# Patient Record
Sex: Female | Born: 1940 | Race: White | Hispanic: No | Marital: Married | Smoking: Never smoker
Health system: Southern US, Community
[De-identification: ages and names within clinical notes are randomized; demographics above are authoritative.]

## PROBLEM LIST (undated history)

## (undated) DIAGNOSIS — M1990 Unspecified osteoarthritis, unspecified site: Secondary | ICD-10-CM

## (undated) DIAGNOSIS — I10 Essential (primary) hypertension: Secondary | ICD-10-CM

## (undated) HISTORY — PX: ULNAR TUNNEL RELEASE: SHX820

## (undated) HISTORY — DX: Unspecified osteoarthritis, unspecified site: M19.90

## (undated) HISTORY — PX: HAND SURGERY: SHX662

## (undated) HISTORY — DX: Essential (primary) hypertension: I10

---

## 2003-09-01 ENCOUNTER — Other Ambulatory Visit: Admission: RE | Admit: 2003-09-01 | Discharge: 2003-09-01 | Payer: Self-pay | Admitting: Family Medicine

## 2003-09-09 ENCOUNTER — Ambulatory Visit (HOSPITAL_COMMUNITY): Admission: RE | Admit: 2003-09-09 | Discharge: 2003-09-09 | Payer: Self-pay | Admitting: Family Medicine

## 2003-10-20 ENCOUNTER — Encounter (INDEPENDENT_AMBULATORY_CARE_PROVIDER_SITE_OTHER): Payer: Self-pay | Admitting: *Deleted

## 2003-10-20 ENCOUNTER — Ambulatory Visit (HOSPITAL_COMMUNITY): Admission: RE | Admit: 2003-10-20 | Discharge: 2003-10-20 | Payer: Self-pay | Admitting: Gastroenterology

## 2005-04-23 ENCOUNTER — Ambulatory Visit: Payer: Self-pay | Admitting: Gastroenterology

## 2005-04-25 ENCOUNTER — Encounter (INDEPENDENT_AMBULATORY_CARE_PROVIDER_SITE_OTHER): Payer: Self-pay | Admitting: Specialist

## 2005-04-25 ENCOUNTER — Ambulatory Visit: Payer: Self-pay | Admitting: Gastroenterology

## 2007-08-04 ENCOUNTER — Ambulatory Visit: Payer: Self-pay | Admitting: Gastroenterology

## 2007-09-02 ENCOUNTER — Ambulatory Visit: Payer: Self-pay | Admitting: Gastroenterology

## 2008-04-02 ENCOUNTER — Ambulatory Visit: Payer: Self-pay | Admitting: Family Medicine

## 2008-04-02 ENCOUNTER — Observation Stay (HOSPITAL_COMMUNITY): Admission: EM | Admit: 2008-04-02 | Discharge: 2008-04-05 | Payer: Self-pay | Admitting: Family Medicine

## 2008-04-04 ENCOUNTER — Encounter: Payer: Self-pay | Admitting: Gastroenterology

## 2008-04-06 ENCOUNTER — Ambulatory Visit: Payer: Self-pay | Admitting: Gastroenterology

## 2008-04-12 ENCOUNTER — Ambulatory Visit: Payer: Self-pay | Admitting: Gastroenterology

## 2008-04-19 ENCOUNTER — Ambulatory Visit: Payer: Self-pay | Admitting: Gastroenterology

## 2008-07-15 ENCOUNTER — Encounter: Admission: RE | Admit: 2008-07-15 | Discharge: 2008-07-15 | Payer: Self-pay | Admitting: Family Medicine

## 2008-08-03 ENCOUNTER — Encounter
Admission: RE | Admit: 2008-08-03 | Discharge: 2008-08-03 | Payer: Self-pay | Admitting: Physical Medicine and Rehabilitation

## 2008-09-29 ENCOUNTER — Ambulatory Visit (HOSPITAL_COMMUNITY): Admission: RE | Admit: 2008-09-29 | Discharge: 2008-09-29 | Payer: Self-pay | Admitting: Gastroenterology

## 2008-10-04 ENCOUNTER — Ambulatory Visit (HOSPITAL_COMMUNITY): Admission: RE | Admit: 2008-10-04 | Discharge: 2008-10-04 | Payer: Self-pay | Admitting: Gastroenterology

## 2009-08-31 ENCOUNTER — Encounter: Admission: RE | Admit: 2009-08-31 | Discharge: 2009-08-31 | Payer: Self-pay | Admitting: Family Medicine

## 2010-04-05 ENCOUNTER — Inpatient Hospital Stay (HOSPITAL_COMMUNITY): Admission: EM | Admit: 2010-04-05 | Discharge: 2010-04-06 | Payer: Self-pay | Admitting: Emergency Medicine

## 2010-05-30 ENCOUNTER — Encounter: Admission: RE | Admit: 2010-05-30 | Discharge: 2010-05-30 | Payer: Self-pay | Admitting: Gastroenterology

## 2010-11-17 ENCOUNTER — Encounter: Payer: Self-pay | Admitting: Gastroenterology

## 2011-01-14 LAB — CBC
HCT: 36.6 % (ref 36.0–46.0)
HCT: 44.9 % (ref 36.0–46.0)
Hemoglobin: 12.1 g/dL (ref 12.0–15.0)
Hemoglobin: 15.2 g/dL — ABNORMAL HIGH (ref 12.0–15.0)
MCHC: 33.8 g/dL (ref 30.0–36.0)
MCV: 91.4 fL (ref 78.0–100.0)
MCV: 92.2 fL (ref 78.0–100.0)
Platelets: 241 10*3/uL (ref 150–400)
RBC: 3.97 MIL/uL (ref 3.87–5.11)
RBC: 4.91 MIL/uL (ref 3.87–5.11)
RDW: 13.1 % (ref 11.5–15.5)
WBC: 12.6 10*3/uL — ABNORMAL HIGH (ref 4.0–10.5)
WBC: 4.6 10*3/uL (ref 4.0–10.5)

## 2011-01-14 LAB — URINE MICROSCOPIC-ADD ON

## 2011-01-14 LAB — DIFFERENTIAL
Basophils Absolute: 0 10*3/uL (ref 0.0–0.1)
Basophils Relative: 0 % (ref 0–1)
Eosinophils Absolute: 0.1 10*3/uL (ref 0.0–0.7)
Eosinophils Relative: 1 % (ref 0–5)
Lymphocytes Relative: 17 % (ref 12–46)
Lymphs Abs: 2.2 10*3/uL (ref 0.7–4.0)
Monocytes Absolute: 0.7 10*3/uL (ref 0.1–1.0)
Monocytes Relative: 6 % (ref 3–12)
Neutro Abs: 9.6 10*3/uL — ABNORMAL HIGH (ref 1.7–7.7)
Neutrophils Relative %: 76 % (ref 43–77)

## 2011-01-14 LAB — HEPATIC FUNCTION PANEL
ALT: 22 U/L (ref 0–35)
AST: 28 U/L (ref 0–37)
Albumin: 4.4 g/dL (ref 3.5–5.2)
Alkaline Phosphatase: 47 U/L (ref 39–117)
Bilirubin, Direct: <0.1 mg/dL (ref 0.0–0.3)
Total Bilirubin: 0.9 mg/dL (ref 0.3–1.2)
Total Protein: 7.7 g/dL (ref 6.0–8.3)

## 2011-01-14 LAB — POCT CARDIAC MARKERS
CKMB, poc: 2.6 ng/mL (ref 1.0–8.0)
Myoglobin, poc: 71.1 ng/mL (ref 12–200)
Myoglobin, poc: 80.3 ng/mL (ref 12–200)

## 2011-01-14 LAB — URINALYSIS, ROUTINE W REFLEX MICROSCOPIC
Bilirubin Urine: NEGATIVE
Glucose, UA: NEGATIVE mg/dL
Hgb urine dipstick: NEGATIVE
Ketones, ur: >80 mg/dL — AB
Nitrite: NEGATIVE
Protein, ur: NEGATIVE mg/dL
Specific Gravity, Urine: 1.016 (ref 1.005–1.030)
Urobilinogen, UA: 0.2 mg/dL (ref 0.0–1.0)
pH: 8 (ref 5.0–8.0)

## 2011-01-14 LAB — POCT I-STAT, CHEM 8
BUN: 13 mg/dL (ref 6–23)
Calcium, Ion: 1.11 mmol/L — ABNORMAL LOW (ref 1.12–1.32)
Chloride: 102 mEq/L (ref 96–112)
Creatinine, Ser: 0.8 mg/dL (ref 0.4–1.2)
Glucose, Bld: 129 mg/dL — ABNORMAL HIGH (ref 70–99)
HCT: 48 % — ABNORMAL HIGH (ref 36.0–46.0)
Hemoglobin: 16.3 g/dL — ABNORMAL HIGH (ref 12.0–15.0)
Potassium: 3.6 mEq/L (ref 3.5–5.1)
Sodium: 139 mEq/L (ref 135–145)
TCO2: 31 mmol/L (ref 0–100)

## 2011-01-14 LAB — COMPREHENSIVE METABOLIC PANEL
AST: 20 U/L (ref 0–37)
CO2: 27 mEq/L (ref 19–32)
Chloride: 111 mEq/L (ref 96–112)
Creatinine, Ser: 0.63 mg/dL (ref 0.4–1.2)
GFR calc Af Amer: >60 mL/min (ref >60)
GFR calc non Af Amer: >60 mL/min (ref >60)
Glucose, Bld: 94 mg/dL (ref 70–99)
Total Bilirubin: 0.7 mg/dL (ref 0.3–1.2)

## 2011-01-14 LAB — URINE CULTURE: Colony Count: 5000

## 2011-01-14 LAB — PROTIME-INR
INR: 0.84 (ref 0.00–1.49)
Prothrombin Time: 11.4 seconds — ABNORMAL LOW (ref 11.6–15.2)

## 2011-01-14 LAB — LIPASE, BLOOD: Lipase: 25 U/L (ref 11–59)

## 2011-03-12 NOTE — H&P (Signed)
Briana Gentry, Briana Gentry               ACCOUNT NO.:  [id number]   MEDICAL RECORD NO.:  [id number]          PATIENT TYPE:  OBV   LOCATION:  5154                         FACILITY:  [facility]   PHYSICIAN:  Santiago Bumpers. Hensel, M.D.DATE OF BIRTH:  [birth year]   DATE OF ADMISSION:  04/02/2008  DATE OF DISCHARGE:                              HISTORY & PHYSICAL   CHIEF COMPLAINT:  Abdominal pain and vomiting.   PRIMARY CARE PHYSICIAN:  Unassigned.   HISTORY OF PRESENT ILLNESS:  This is a 70 year old female with history  of hypertension and questionable duodenal ulcer who presents with  abdominal pain, nausea, and vomiting x2 days.  Pain started as  periumbilical and epigastric cramping characterized as rolling pain that  radiated to the right lower quadrant.  No radiation to back, not tearing  in nature.  Also, the patient with nausea and vomiting.  Nonbilious,  nonbloody.  The patient seen in [facility], given Phenergan x2,  which resolved nausea, but 1 day prior to admission, pain returned so  the patient returned to [place]. White blood cells 14.  The patient  admitted for rehydration and observation.  Of note, the patient recently  had decreased p.o. intake and had diarrhea x1.  No blood in stool.  The  patient has history previously of 1 similar episode 2 years ago, not as  severe though.  Of note, the patient also has history of ulcer.   ALLERGIES:  None.   PAST MEDICAL HISTORY:  1. Hypertension.  2. Osteoarthritis.  3. Hyperlipidemia.  4. Ulcer at the top of intestine per patient, noted in 2004.  5. History of Schatzki ring of the esophagus in 2004, status post      esophageal dilation.   MEDICATIONS:  1. Naprosyn 500 mg 1 p.o. b.i.d. for very long time.  2. Felodipine, unknown dose.  3. Advicor, unknown dose.   FAMILY HISTORY:  History of diabetes, cancer, emphysema, and  hypertension.   SOCIAL HISTORY:  The patient quit smoking in 1992.  The patient drinks  alcohol  socially.  She lives with her husband and they travel throughout  the country with RV.  The patient is homebound, is living in [place].   PHYSICAL EXAM:  VITAL SIGNS:  Temperature 98.5, heart rate 104,  respiratory 20, blood pressure 149/88, and O2 sat 97% on room air.  GENERAL:  Obviously uncomfortable, unable to find comfortable position  on bed.  HEENT:  Normocephalic, atraumatic.  Pupils are equally round and  reactive to light.  Extraocular movements intact.  Dry mucous membranes.  No pharyngeal erythema or edema.  No lymphadenopathy.  CVS:  Normal S1 and S2.  No murmurs, rubs, or gallops, tachycardic.  PULMONARY:  Decreased breath sounds on the left side, otherwise no  crackles or wheezing.  ABDOMEN:  Soft, but involuntary guarding upon palpation.  Positive  rebound epigastrically.  Tender to light palpation in the epigastric  region.  Tender to deep palpation diffusely throughout, nondistended.  No hepatosplenomegaly.  EXTREMITIES:  No clubbing, cyanosis, or edema.  SKIN:  No rash or jaundice.   White blood  cell at Urgent Care 14.8 along with hemoglobin 30.0,  hematocrit 41, and platelets 370.  Labs here are pending.   ASSESSMENT AND PLAN:  This is a 70 year old female with history of  hypertension and questionable duodenal ulcer, presented with 2-day  history of nausea, vomiting, and abdominal pain who has been afebrile  throughout.  1. Abdominal pain with vomiting, broad differential most concerning      with the acute abdomen with appendicitis or perforated ulcer versus      cholecystitis versus ruptured abdominal aortic aneurysm.  Recheck      hemoglobin.  CT with contrast to evaluate abdomen for peritonitis      etiology. Serial abdominal exams.  Consult surgery depending on      results of CT scan.  Check CMP, CBC, PT/PTT, INR and consider in      differential acute gastroenteritis.  The patient remains afebrile.      Given concern for acute abdomen, prep the patient for  surgery with      chest x-ray EKG, PT/INR.  If impressed with CTs, consider starting      antibiotics for coverage of gastrointestinal bugs.  2. Nausea.  Zofran and Phenergan IV.  3. Hypertension, continue felodipine.  4. Hyperlipidemia, continue Advicor  5. Osteoarthritis, hold Naprosyn for now.  6. Fluids, electrolytes, nutrition, n.p.o. for now, maintenance IV      fluids after a bolus.  7. Disposition pending CT results and add pain control with morphine      IV.      Eustaquio Boyden, MD  Electronically Signed      Santiago Bumpers. Leveda Anna, M.D.  Electronically Signed    JG/MEDQ  D:  04/02/2008  T:  04/03/2008  Job:  [id number]

## 2011-03-12 NOTE — Discharge Summary (Signed)
NAME:  Briana Gentry, Briana Gentry               ACCOUNT NO.:  [id number]   MEDICAL RECORD NO.:  [id number]          PATIENT TYPE:  OBV   LOCATION:  5154                         FACILITY:  [facility]   PHYSICIAN:  Wayne A. Sheffield Slider, M.D.    DATE OF BIRTH:  Jul 06, 1941   DATE OF ADMISSION:  04/02/2008  DATE OF DISCHARGE:  04/05/2008                               DISCHARGE SUMMARY   PRIMARY CARE PHYSICIAN:  Charlesetta Shanks.   CONSULT:  Gastroenterology, Barbette Hair. Arlyce Dice, MD, [facility]   DISCHARGE DIAGNOSES:  1. 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  2. Hypertension.  3. Osteoarthritis.  4. Hyperlipidemia.   DISCHARGE MEDICATIONS:  1. Tylenol 650 mg q.6 h. for arthritic pain.  2. Felodipine as previous regimen.  3. Advicor one daily.  4. Protonix 20 mg daily or equivalent.  5. Keflex 500 mg q.i.d. for 7 days.   PROCEDURES:  The patient underwent esophageal dilation and upper  endoscopy per GI on April 04, 2008.  1. Chest x-ray showing no active disease.  2. Pelvic and abdominal CT showing marked distal esophagitis extending      into the gastroesophageal junction and probable chronic wall      thickening involving distal gastric antrum and pylorus due to the      patient's previous ulcer disease.  Active inflammation also      possibility.  No acute pelvic abnormality.   ADMISSION LABS:  White blood cell 10.6, hemoglobin 14.2, platelets 275,  PT 12.1, and INR 0.9.  Sodium 135, potassium 2.8, BUN 12, and creatinine  0.62.  LFTs within normal limits, lipase 14.  Urinalysis, nitrite  negative.  Leukocyte esterase trace, microscopic with rare bacteria.   DISCHARGE LABS:  Blood culture x2, no growth.  Urine culture initial  with insignificant gross growth.  Repeat urinalysis showing positive  nitrites, large leukocyte esterases along with 40 ketones and many  bacteria.  Urine culture repeat pending.  White blood cell 10.2,  platelets 292, hemoglobin 11.3, sodium 142, glucose 89, bicarb 29, BUN  30, creatinine 0.72, and calcium 8.5.   HOSPITAL COURSE:  For full summary please see dictated H&P.  In short,  this is a 70 year old pleasant female with history of Schatzki ring  status post esophageal dilation in 2004 who presented with nausea,  vomiting, and abdominal pain and found to have distal esophagitis.  1. GI.  The patient presented with evidence of repeat esophageal      stricture.  Gastroenterology was consulted and they performed a      upper GI with esophageal dilation.  No evidence of ulcers were      found here.  The patient will return in 2 weeks for followup with      GI for repeat dilation given the size of the stricture.  The      patient's nausea and abdominal pain resolved on hospitalization.      The patient is not to take any more NSAIDs.  Abdominal pain was      thought to be contributed by chronic Naprosyn use for      osteoarthritis.  The patient sent home with prescription for      Protonix or Prilosec, whichever is cheaper for her.  2. Hypertension.  Initially, the patient presented hypotensive with      systolics in 90s-10s.  Upon discharge the patient's felodipine was      restarted with good tolerance.  3. Hyperlipidemia.  Advicor was continued throughout hospitalization.  4. Osteoarthritis.  Naprosyn was discontinued and the patient was      instructed to use high-dose Tylenol to control pain.  This is to be      followed up by PCP.  5. Dysuria.  Repeat ultrasound showing urinary tract infection.  Urine      culture pending.  The patient sent home with Keflex 500 mg q.i.d.      to complete a 7-day course.  The patient expressed understanding.   FOLLOWUP:  The patient to follow up with Dr. Celene Skeen, PCP, on April 12, 2008 at 4:00 p.m. and the patient to follow up with Dr. Arlyce Dice as well  on April 12, 2008 at 8:00 a.m. in the morning.      Eustaquio Boyden, MD  Electronically Signed      Arnette Norris. Sheffield Slider, M.D.  Electronically Signed    JG/MEDQ   D:  04/05/2008  T:  04/06/2008  Job:  [id number]   cc:   Haydee Salter. Arlyce Dice, MD,FACG

## 2011-07-25 LAB — COMPREHENSIVE METABOLIC PANEL
ALT: 15
BUN: 12
BUN: 8
Calcium: 7.4 — ABNORMAL LOW
Calcium: 8.4
Creatinine, Ser: 0.68
Glucose, Bld: 102 — ABNORMAL HIGH
Glucose, Bld: 121 — ABNORMAL HIGH
Sodium: 132 — ABNORMAL LOW
Total Protein: 5.3 — ABNORMAL LOW
Total Protein: 6.2

## 2011-07-25 LAB — CBC
HCT: 29.5 — ABNORMAL LOW
HCT: 31.2 — ABNORMAL LOW
HCT: 42.8
Hemoglobin: 10.6 — ABNORMAL LOW
Hemoglobin: 10.8 — ABNORMAL LOW
Hemoglobin: 14.2
MCHC: 33.3
MCHC: 33.8
MCV: 87.6
MCV: 87.7
MCV: 88
Platelets: 221
Platelets: 292
RBC: 3.56 — ABNORMAL LOW
RDW: 15.7 — ABNORMAL HIGH
RDW: 16.2 — ABNORMAL HIGH
RDW: 16.4 — ABNORMAL HIGH
RDW: 16.7 — ABNORMAL HIGH
WBC: 10.2
WBC: 12.2 — ABNORMAL HIGH

## 2011-07-25 LAB — CULTURE, BLOOD (ROUTINE X 2): Culture: NO GROWTH

## 2011-07-25 LAB — BASIC METABOLIC PANEL
BUN: 3 — ABNORMAL LOW
Creatinine, Ser: 0.72
GFR calc non Af Amer: >60
Glucose, Bld: 89

## 2011-07-25 LAB — URINALYSIS, ROUTINE W REFLEX MICROSCOPIC
Glucose, UA: 100 — AB
Hgb urine dipstick: NEGATIVE
Ketones, ur: 40 — AB
Ketones, ur: 40 — AB
Nitrite: POSITIVE — AB
Protein, ur: NEGATIVE
Specific Gravity, Urine: 1.008
Urobilinogen, UA: 1
pH: 6.5

## 2011-07-25 LAB — PROTIME-INR
INR: 0.9
Prothrombin Time: 12.1

## 2011-07-25 LAB — DIFFERENTIAL
Lymphocytes Relative: 10 — ABNORMAL LOW
Lymphs Abs: 1
Monocytes Relative: 3
Neutro Abs: 9.2 — ABNORMAL HIGH
Neutrophils Relative %: 87 — ABNORMAL HIGH

## 2011-07-25 LAB — URINE MICROSCOPIC-ADD ON

## 2011-07-25 LAB — URINE CULTURE

## 2011-07-25 LAB — APTT: aPTT: 25

## 2011-09-24 ENCOUNTER — Other Ambulatory Visit: Payer: Self-pay | Admitting: Family Medicine

## 2011-09-24 DIAGNOSIS — Z1231 Encounter for screening mammogram for malignant neoplasm of breast: Secondary | ICD-10-CM

## 2011-10-03 ENCOUNTER — Ambulatory Visit
Admission: RE | Admit: 2011-10-03 | Discharge: 2011-10-03 | Disposition: A | Discharge status: Home / Self Care | Payer: Medicare Other | Source: Ambulatory Visit | Attending: Family Medicine | Admitting: Family Medicine

## 2011-10-03 DIAGNOSIS — Z1231 Encounter for screening mammogram for malignant neoplasm of breast: Secondary | ICD-10-CM

## 2016-02-16 ENCOUNTER — Encounter: Payer: Self-pay | Admitting: Gastroenterology

## 2020-07-20 ENCOUNTER — Other Ambulatory Visit: Payer: Self-pay

## 2020-07-20 ENCOUNTER — Ambulatory Visit (HOSPITAL_COMMUNITY)
Admission: EM | Admit: 2020-07-20 | Discharge: 2020-07-20 | Disposition: A | Discharge status: Home / Self Care | Payer: Medicare Other

## 2020-07-20 ENCOUNTER — Emergency Department (HOSPITAL_COMMUNITY)
Admission: EM | Admit: 2020-07-20 | Discharge: 2020-07-21 | Disposition: A | Discharge status: Home / Self Care | Payer: Medicare Other | Attending: Emergency Medicine | Admitting: Emergency Medicine

## 2020-07-20 ENCOUNTER — Encounter (HOSPITAL_COMMUNITY): Payer: Self-pay | Admitting: Emergency Medicine

## 2020-07-20 DIAGNOSIS — I10 Essential (primary) hypertension: Secondary | ICD-10-CM | POA: Insufficient documentation

## 2020-07-20 DIAGNOSIS — R1013 Epigastric pain: Secondary | ICD-10-CM | POA: Insufficient documentation

## 2020-07-20 DIAGNOSIS — R1033 Periumbilical pain: Secondary | ICD-10-CM | POA: Diagnosis not present

## 2020-07-20 DIAGNOSIS — R112 Nausea with vomiting, unspecified: Secondary | ICD-10-CM | POA: Diagnosis not present

## 2020-07-20 DIAGNOSIS — Z79899 Other long term (current) drug therapy: Secondary | ICD-10-CM | POA: Insufficient documentation

## 2020-07-20 DIAGNOSIS — R109 Unspecified abdominal pain: Secondary | ICD-10-CM | POA: Diagnosis present

## 2020-07-20 LAB — CBC
HCT: 40.8 % (ref 36.0–46.0)
Hemoglobin: 13.5 g/dL (ref 12.0–15.0)
MCH: 28.9 pg (ref 26.0–34.0)
MCHC: 33.1 g/dL (ref 30.0–36.0)
MCV: 87.4 fL (ref 80.0–100.0)
Platelets: 445 10*3/uL — ABNORMAL HIGH (ref 150–400)
RBC: 4.67 MIL/uL (ref 3.87–5.11)
RDW: 13.2 % (ref 11.5–15.5)
WBC: 13.1 10*3/uL — ABNORMAL HIGH (ref 4.0–10.5)
nRBC: 0 % (ref 0.0–0.2)

## 2020-07-20 LAB — COMPREHENSIVE METABOLIC PANEL
ALT: 20 U/L (ref 0–44)
AST: 24 U/L (ref 15–41)
Albumin: 4.2 g/dL (ref 3.5–5.0)
Alkaline Phosphatase: 60 U/L (ref 38–126)
Anion gap: 14 (ref 5–15)
BUN: 13 mg/dL (ref 8–23)
CO2: 26 mmol/L (ref 22–32)
Calcium: 9.3 mg/dL (ref 8.9–10.3)
Chloride: 92 mmol/L — ABNORMAL LOW (ref 98–111)
Creatinine, Ser: 0.64 mg/dL (ref 0.44–1.00)
GFR calc Af Amer: >60 mL/min (ref >60)
GFR calc non Af Amer: >60 mL/min (ref >60)
Glucose, Bld: 106 mg/dL — ABNORMAL HIGH (ref 70–99)
Potassium: 3.9 mmol/L (ref 3.5–5.1)
Sodium: 132 mmol/L — ABNORMAL LOW (ref 135–145)
Total Bilirubin: 1.2 mg/dL (ref 0.3–1.2)
Total Protein: 7 g/dL (ref 6.5–8.1)

## 2020-07-20 LAB — LIPASE, BLOOD: Lipase: 21 U/L (ref 11–51)

## 2020-07-20 LAB — TROPONIN I (HIGH SENSITIVITY): Troponin I (High Sensitivity): 8 ng/L (ref <18)

## 2020-07-20 NOTE — ED Triage Notes (Signed)
Pt reports mid abd pain/ "spasms" with nausea and vomiting since 1am.   Denies diarrhea.

## 2020-07-20 NOTE — ED Triage Notes (Signed)
Pt c/o emesis around 1 am yesterday. Pt states she had some promethazine and took it which calmed down her symptoms and allowed her to sleep. She states this is a recurrent problem where she will start vomiting and has to go to the hospital. She has RUQ abd pain and periumbilical pain.

## 2020-07-20 NOTE — ED Notes (Signed)
Patient is being discharged from the Urgent Care Center and sent to the Emergency Department via wheelchair by staff. Per Wallis Bamberg, PA-C, patient is stable but in need of higher level of care due to abd pain and high blood pressure, persistent emesis. Patient is aware and verbalizes understanding of plan of care.  Vitals:   07/20/20 1542  BP: (!) 180/69  Pulse: 70  Resp: 16  Temp: 98.4 F (36.9 C)  SpO2: 100%

## 2020-07-21 ENCOUNTER — Emergency Department (HOSPITAL_COMMUNITY): Payer: Medicare Other

## 2020-07-21 DIAGNOSIS — R1013 Epigastric pain: Secondary | ICD-10-CM | POA: Diagnosis not present

## 2020-07-21 LAB — URINALYSIS, MICROSCOPIC (REFLEX)
RBC / HPF: NONE SEEN RBC/hpf (ref 0–5)
WBC, UA: >50 WBC/hpf (ref 0–5)

## 2020-07-21 LAB — URINALYSIS, ROUTINE W REFLEX MICROSCOPIC
Bilirubin Urine: NEGATIVE
Glucose, UA: NEGATIVE mg/dL
Hgb urine dipstick: NEGATIVE
Ketones, ur: 15 mg/dL — AB
Nitrite: NEGATIVE
Protein, ur: NEGATIVE mg/dL
Specific Gravity, Urine: 1.015 (ref 1.005–1.030)
pH: 6.5 (ref 5.0–8.0)

## 2020-07-21 MED ORDER — OXYCODONE-ACETAMINOPHEN 5-325 MG PO TABS: 1.0000 | ORAL_TABLET | ORAL | 0 refills | Status: AC | PRN

## 2020-07-21 MED ORDER — SODIUM CHLORIDE 0.9 % IV BOLUS
1000.0000 mL | Freq: Once | INTRAVENOUS | Status: AC
Start: 2020-07-21 — End: 2020-07-21
  Administered 2020-07-21: 1000 mL via INTRAVENOUS

## 2020-07-21 MED ORDER — ONDANSETRON 4 MG PO TBDP: 4.0000 mg | ORAL_TABLET | Freq: Three times a day (TID) | ORAL | 0 refills | Status: AC | PRN

## 2020-07-21 MED ORDER — IOHEXOL 300 MG/ML  SOLN
100.0000 mL | Freq: Once | INTRAMUSCULAR | Status: AC | PRN
  Administered 2020-07-21: 100 mL via INTRAVENOUS

## 2020-07-21 NOTE — ED Notes (Signed)
Assuming care of patient at this time.

## 2020-07-21 NOTE — ED Notes (Signed)
Pt transported to ultrasound.

## 2020-07-21 NOTE — Discharge Instructions (Signed)
Take the prescribed medication as directed.   Watch diet, try to limit fatty/greasy/fried/processed foods as this can worsen issues with gallbladder. Follow-up with the general surgery clinic, they can get your HIDA scan scheduled. Return to the ED for new or worsening symptoms.

## 2020-07-21 NOTE — ED Provider Notes (Signed)
[facility] EMERGENCY DEPARTMENT Provider Note   CSN: [id number] Arrival date & time: 07/20/20  1609     History Chief Complaint  Patient presents with  . Abdominal Pain    Briana Gentry is a 79 y.o. female.  The history is provided by the patient and medical records.  Abdominal Pain Associated symptoms: nausea and vomiting     79 y.o. F with hx of HTN, arthritis, presenting to the ED for abdominal pain and vomiting.  States symptoms began yesterday evening with nausea, then progressed to profuse vomiting around 1 AM.  States she used a Phenergan suppository and got a little bit of sleep but has otherwise been vomiting pretty much all day today.  States she has had issues like this in the past without known cause.  She does report some mid and upper abdominal pain.  She has not had any bowel movement in almost 48 hours.  She has not been able to tolerate any food or fluids today whatsoever.  She did attempt to go to urgent care, however was directed here for further evaluation.  She was given Zofran which seemed to help quite a bit with her nausea.  No history of abdominal surgeries.  Past Medical History:  Diagnosis Date  . Arthritis   . Hypertension     There are no problems to display for this patient.   Past Surgical History:  Procedure Laterality Date  . HAND SURGERY    . ULNAR TUNNEL RELEASE       OB History   No obstetric history on file.     Family History  Problem Relation Age of Onset  . Hypertension Mother   . Hyperlipidemia Mother   . Hypertension Father   . Hyperlipidemia Father     Social History   Tobacco Use  . Smoking status: Never Smoker  . Smokeless tobacco: Never Used  Vaping Use  . Vaping Use: Never used  Substance Use Topics  . Alcohol use: Yes    Comment: occaionally   . Drug use: Not on file    Home Medications Prior to Admission medications   Medication Sig Start Date End Date Taking? Authorizing Provider    losartan-hydrochlorothiazide (HYZAAR) 50-12.5 MG tablet Take 1 tablet by mouth daily.    [provider]  promethazine (PHENERGAN) 12.5 MG suppository Place 12.5 mg rectally every 6 (six) hours as needed for nausea or vomiting.    [provider]    Allergies    Patient has no known allergies.  Review of Systems   Review of Systems  Gastrointestinal: Positive for abdominal pain, nausea and vomiting.  All other systems reviewed and are negative.   Physical Exam Updated Vital Signs BP (!) 151/61 (BP Location: Left Arm)   Pulse (!) 104   Temp 98.7 F (37.1 C) (Oral)   Resp 16   Ht 5\' 2"  (1.575 m)   Wt 54.4 kg   SpO2 100%   BMI 21.95 kg/m   Physical Exam Vitals and nursing note reviewed.  Constitutional:      Appearance: She is well-developed.  HENT:     Head: Normocephalic and atraumatic.     Mouth/Throat:     Comments: Dry mucous membranes Eyes:     Conjunctiva/sclera: Conjunctivae normal.     Pupils: Pupils are equal, round, and reactive to light.  Cardiovascular:     Rate and Rhythm: Normal rate and regular rhythm.     Heart sounds: Normal heart  sounds.  Pulmonary:     Effort: Pulmonary effort is normal.     Breath sounds: Normal breath sounds.  Abdominal:     General: Bowel sounds are normal.     Palpations: Abdomen is soft.     Tenderness: There is abdominal tenderness in the epigastric area and periumbilical area.  Musculoskeletal:        General: Normal range of motion.     Cervical back: Normal range of motion.  Skin:    General: Skin is warm and dry.  Neurological:     Mental Status: She is alert and oriented to person, place, and time.     ED Results / Procedures / Treatments   Labs (all labs ordered are listed, but only abnormal results are displayed) Labs Reviewed  CBC - Abnormal; Notable for the following components:      Result Value   WBC 13.1 (*)    Platelets 445 (*)    All other components within normal limits   COMPREHENSIVE METABOLIC PANEL - Abnormal; Notable for the following components:   Sodium 132 (*)    Chloride 92 (*)    Glucose, Bld 106 (*)    All other components within normal limits  URINALYSIS, ROUTINE W REFLEX MICROSCOPIC - Abnormal; Notable for the following components:   APPearance CLOUDY (*)    Ketones, ur 15 (*)    Leukocytes,Ua MODERATE (*)    All other components within normal limits  URINALYSIS, MICROSCOPIC (REFLEX) - Abnormal; Notable for the following components:   Bacteria, UA RARE (*)    All other components within normal limits  LIPASE, BLOOD  TROPONIN I (HIGH SENSITIVITY)    EKG EKG Interpretation  Date/Time:  Thursday July 20 2020 16:20:25 EDT Ventricular Rate:  102 PR Interval:  134 QRS Duration: 84 QT Interval:  368 QTC Calculation: 479 R Axis:   57 Text Interpretation: Sinus tachycardia Nonspecific ST abnormality Abnormal ECG When compared with ECG of 04/05/2010, No significant change was found Confirmed by Delora Fuel ([id number]) on 07/21/2020 12:01:40 AM   Radiology No results found.  Procedures Procedures (including critical care time)  Medications Ordered in ED Medications  sodium chloride 0.9 % bolus 1,000 mL (0 mLs Intravenous Stopped 07/21/20 0231)  iohexol (OMNIPAQUE) 300 MG/ML solution 100 mL (100 mLs Intravenous Contrast Given 07/21/20 0109)    ED Course  I have reviewed the triage vital signs and the nursing notes.  Pertinent labs & imaging results that were available during my care of the patient were reviewed by me and considered in my medical decision making (see chart for details).    MDM Rules/Calculators/A&P  79 y.o. F presenting to the ED for epigastric abdominal pain, nausea, and vomiting for the past 24 hours.  States she has had several episodes like this in the past without known cause.  She is afebrile and nontoxic in appearance.  She does appear clinically dry.  Does have some periumbilical and epigastric tenderness on  exam.  Labs are grossly reassuring aside from a leukocytosis.  LFTs, alk phos, bili and lipase within normal limits.  Will plan for CT for further evaluation.  Given IVF.  Received zofran at [facility] which has controlled nausea well for now.  CT with findings of gallbladder wall thickening and edema.  Right upper quadrant ultrasound was then obtained revealing significant gallbladder wall thickening and pericholecystic fluid without sonographic Murphy sign.  May be related to chronic cholecystitis.  Given patient has had multiple episodes similar to this  in the past, this very likely may represent chronic cholecystitis.  Patient has not had any recurrence of pain while here in the ED, has not required any pain medication or further nausea medication.  She is tolerating oral fluids without difficulty at this time.  Given reassuring laboratory testing and vast improvement of her symptoms, I feel she is stable for discharge.  I will refer her to general surgery for follow-up and HIDA scan as recommended by radiology.  She was counseled on food choices for gallbladder disease.  Rx percocet, zofran.  She may return here for any new/acute changes.  Discussed with attending physician, Dr. Roxanne Mins, who evaluated patient and agrees with plan of care.  Final Clinical Impression(s) / ED Diagnoses Final diagnoses:  Epigastric pain  Non-intractable vomiting with nausea, unspecified vomiting type    Rx / DC Orders ED Discharge Orders         Ordered    oxyCODONE-acetaminophen (PERCOCET) 5-325 MG tablet  Every 4 hours PRN        07/21/20 0447    ondansetron (ZOFRAN ODT) 4 MG disintegrating tablet  Every 8 hours PRN        07/21/20 0447           Larene Pickett, PA-C 62/03/55 [id number]    Delora Fuel, MD 63/84/53 0700

## 2020-11-28 IMAGING — US US ABDOMEN LIMITED
1 series · 14 of 25 positions shown · non-contrast
Comparison: CT from earlier in the same day.

CLINICAL DATA: Epigastric pain and dilated gallbladder on recent CT

EXAM:
ULTRASOUND ABDOMEN LIMITED RIGHT UPPER QUADRANT

[Series 1: us abdomen limited ruq · 14 of 108 slices shown]
[im 1/108]
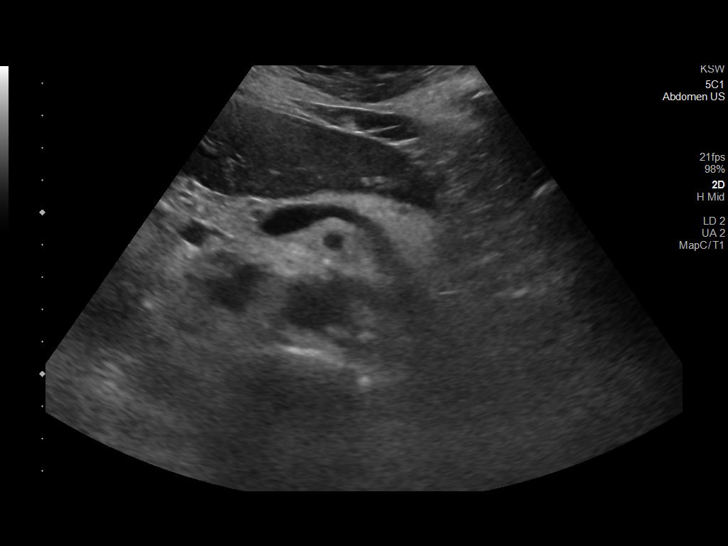
[im 9/108]
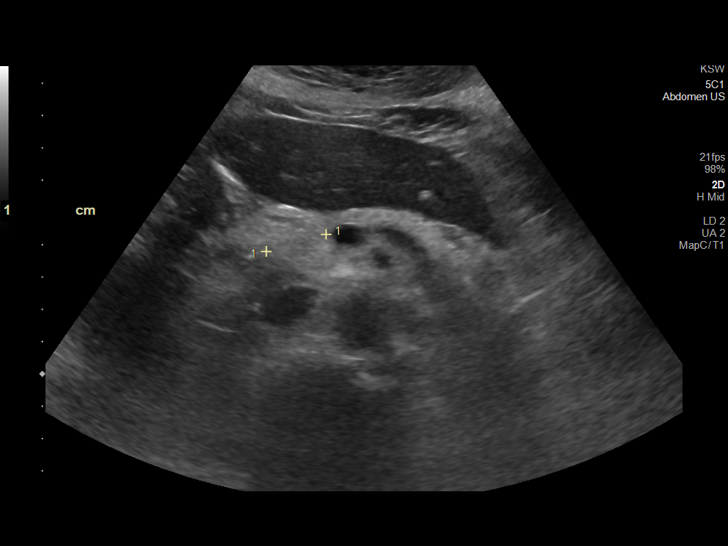
[im 18/108]
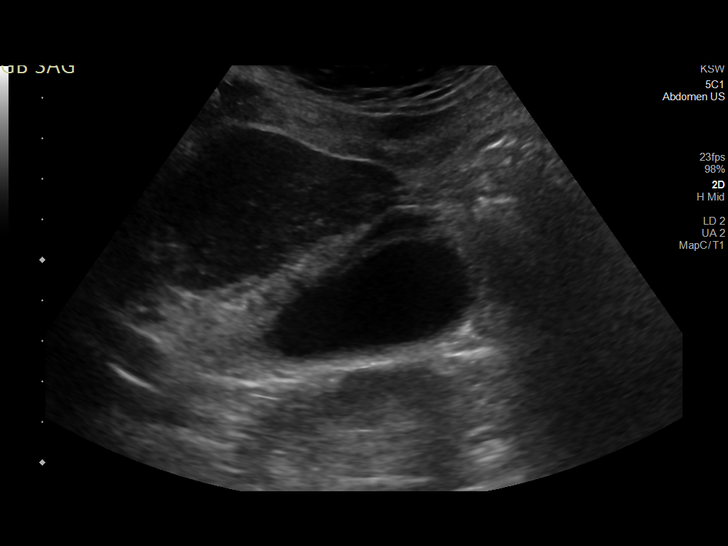
[im 27/108]
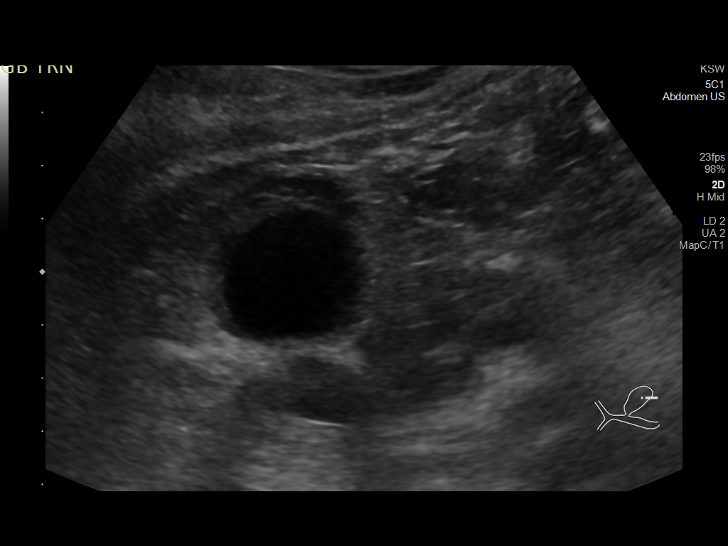
[im 36/108]
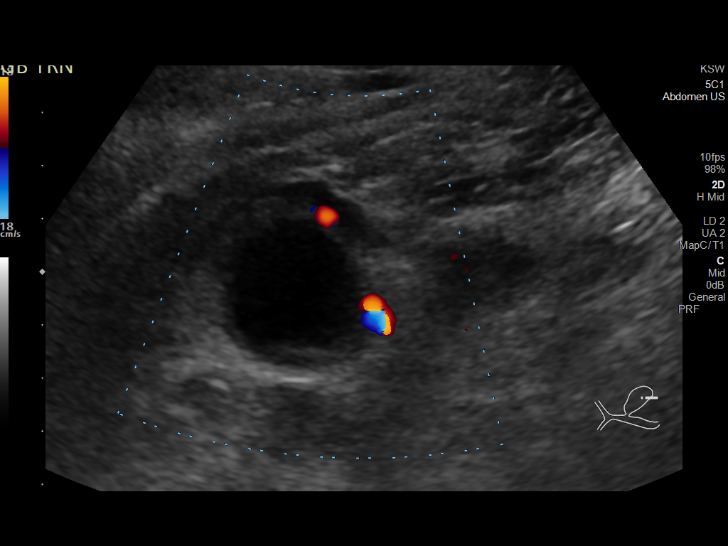
[im 41/108]
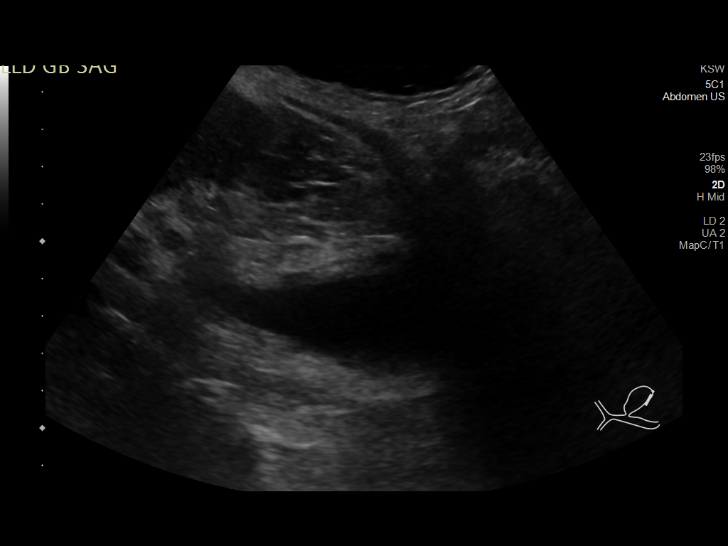
[im 50/108]
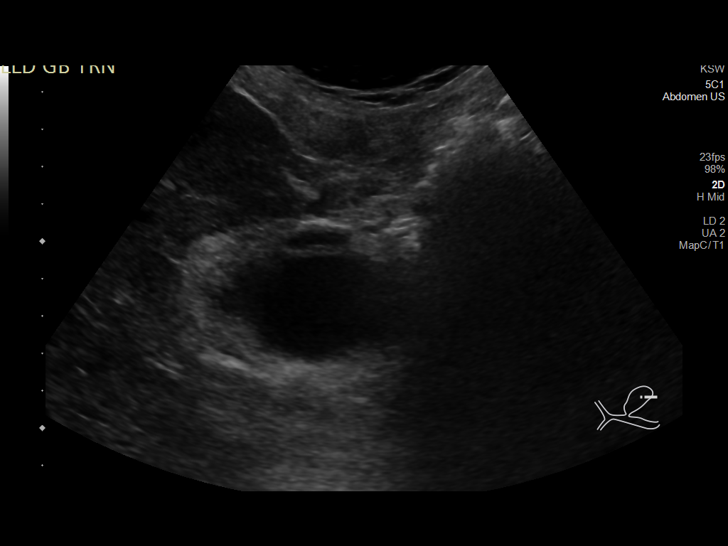
[im 58/108]
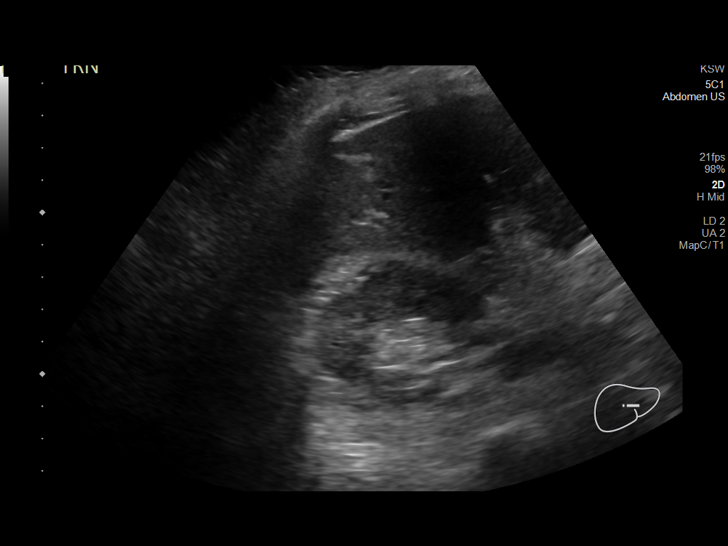
[im 67/108]
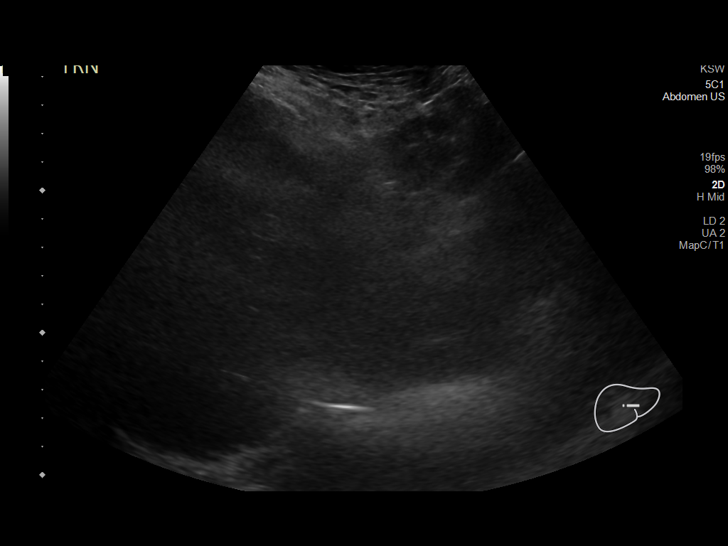
[im 72/108]
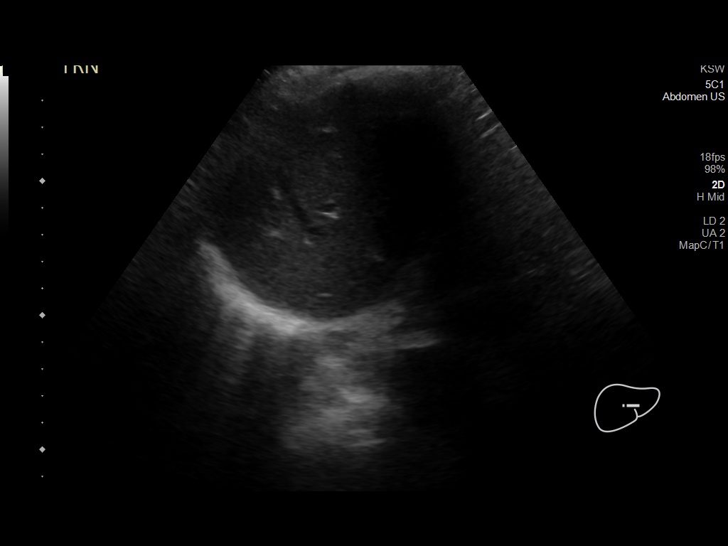
[im 81/108]
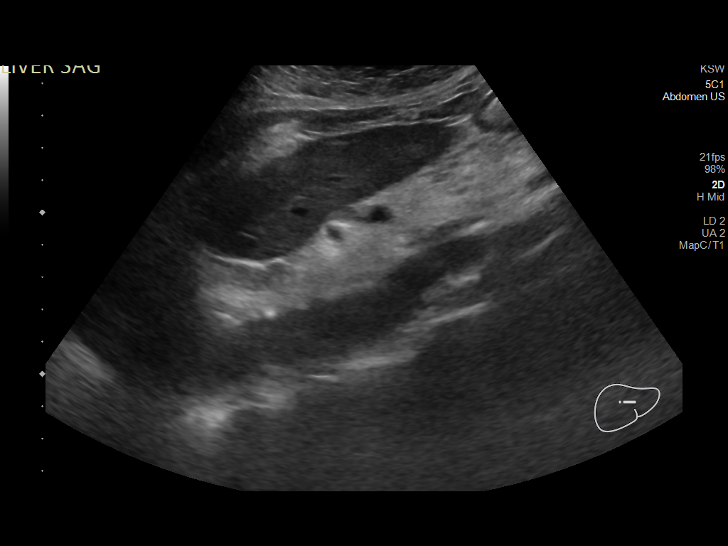
[im 90/108]
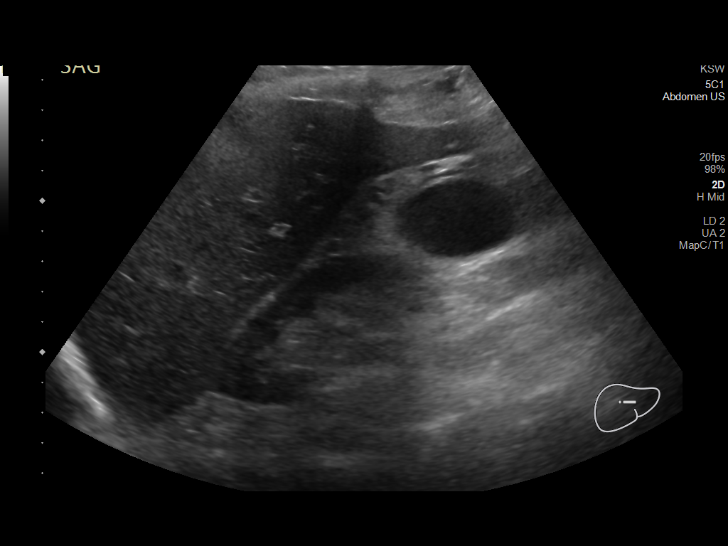
[im 99/108]
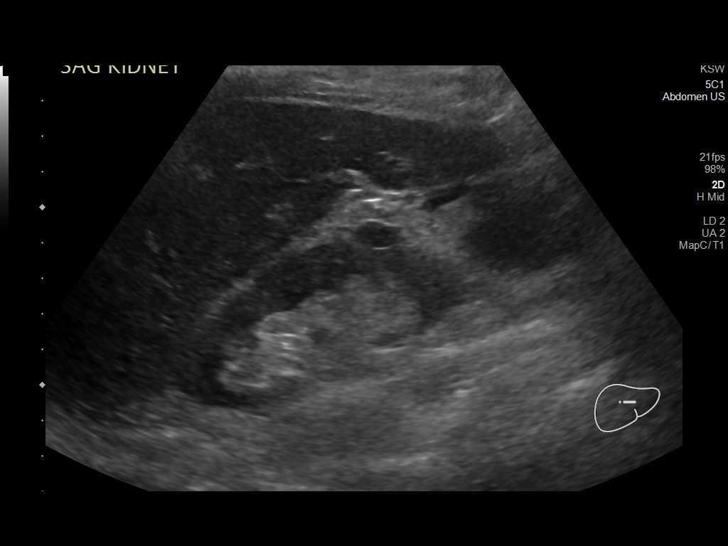
[im 108/108]
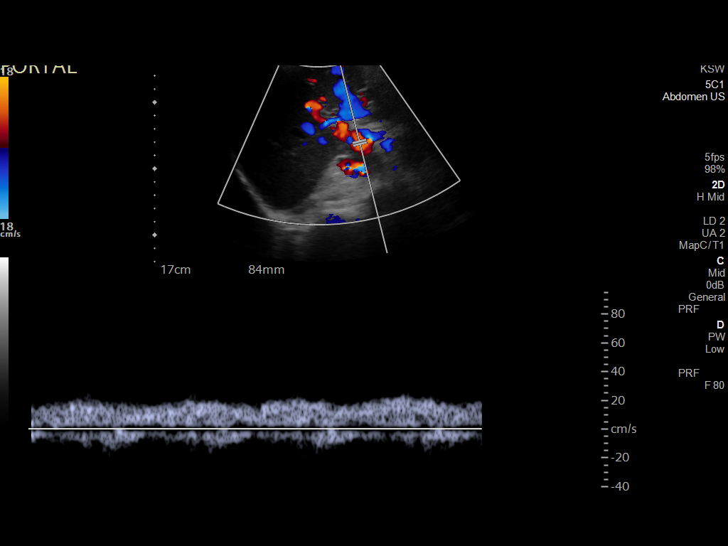

[14 of 25 positions shown; findings below may reference images not displayed]

FINDINGS: Gallbladder:

Gallbladder is well distended. Negative sonographic Murphy's sign is
elicited. Significant wall thickening is noted to 10 mm. Mild
pericholecystic fluid is noted as well.

Common bile duct:

Diameter: 4 mm

Liver:

No focal lesion identified. Within normal limits in parenchymal
echogenicity. Portal vein is patent on color Doppler imaging with
normal direction of blood flow towards the liver.

Other: Right renal cyst is noted measuring 1.1 cm. This is similar
to prior CT examination.
IMPRESSION: Significant gallbladder wall thickening and pericholecystic fluid
with negative sonographic Murphy sign. These changes may be related
to a degree of chronic cholecystitis. Nonemergent HIDA scan may be
helpful to assess for function.

No other acute abnormality is noted.
